# Patient Record
Sex: Female | Born: 1955 | Race: White | Hispanic: No | Marital: Married
Health system: Southern US, Community
[De-identification: ages and names within clinical notes are randomized; demographics above are authoritative.]

---

## 2005-10-03 ENCOUNTER — Emergency Department: Payer: Self-pay | Admitting: Internal Medicine

## 2006-11-03 ENCOUNTER — Emergency Department: Payer: Self-pay | Admitting: Emergency Medicine

## 2007-08-10 ENCOUNTER — Ambulatory Visit: Payer: Self-pay

## 2008-10-25 ENCOUNTER — Ambulatory Visit: Payer: Self-pay

## 2009-10-24 ENCOUNTER — Ambulatory Visit: Payer: Self-pay

## 2010-11-13 ENCOUNTER — Ambulatory Visit: Payer: Self-pay

## 2012-02-10 ENCOUNTER — Ambulatory Visit: Payer: Self-pay

## 2012-03-15 IMAGING — MG MM DIGITAL SCREENING BILAT W/ CAD
1 series · 4 of 4 positions shown · non-contrast
Comparison: none

REASON FOR EXAM: screening
COMMENTS:

[Series 2789: R CC · right · 4 of 4 slices shown]
[im 1/4]
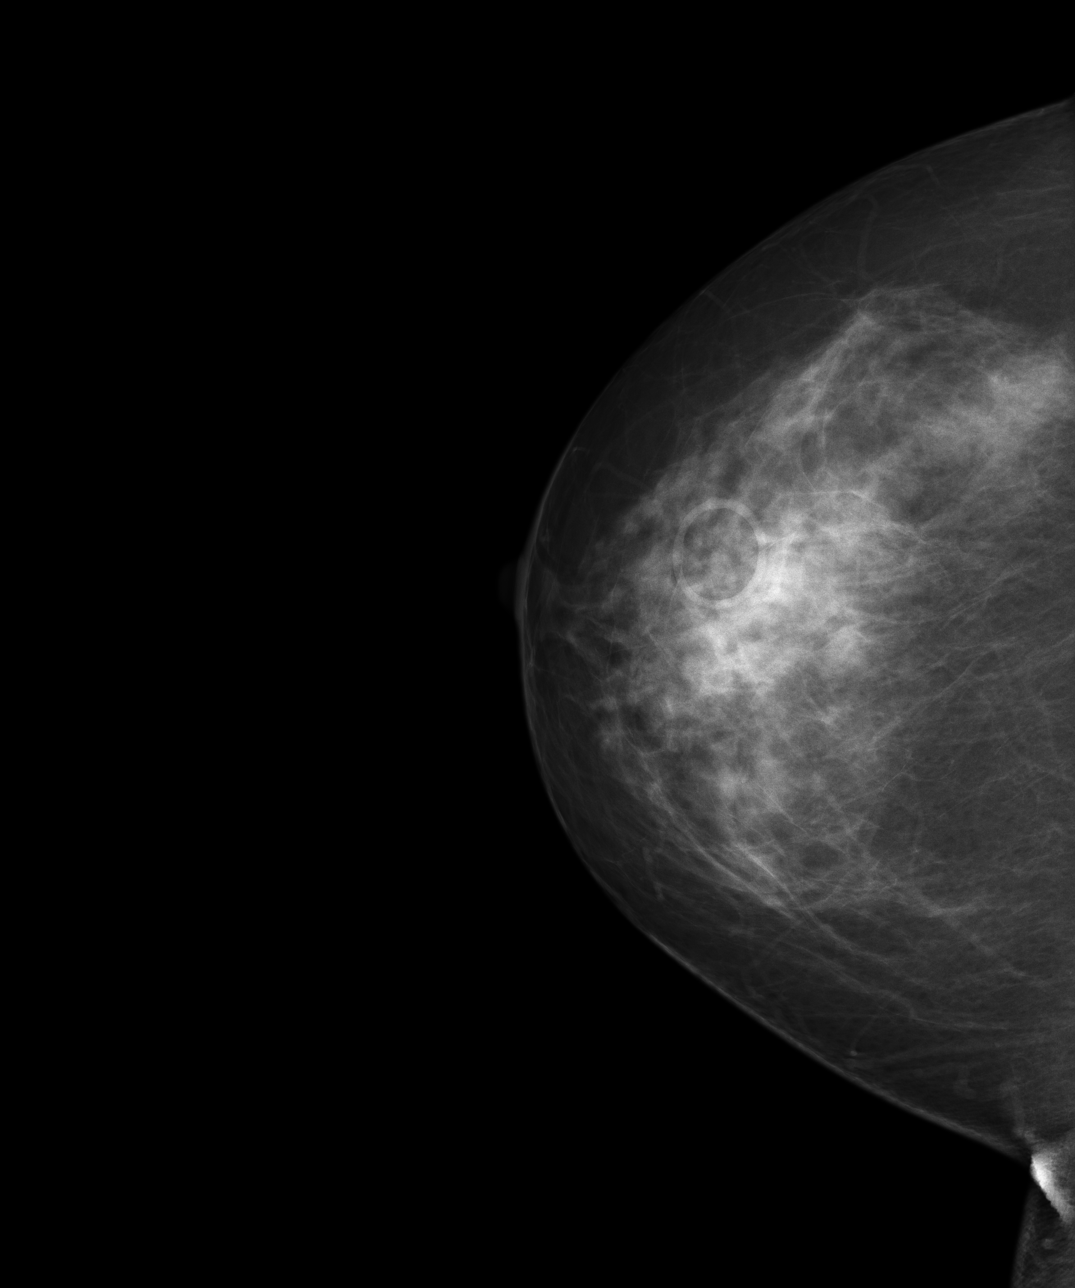
[im 2/4]
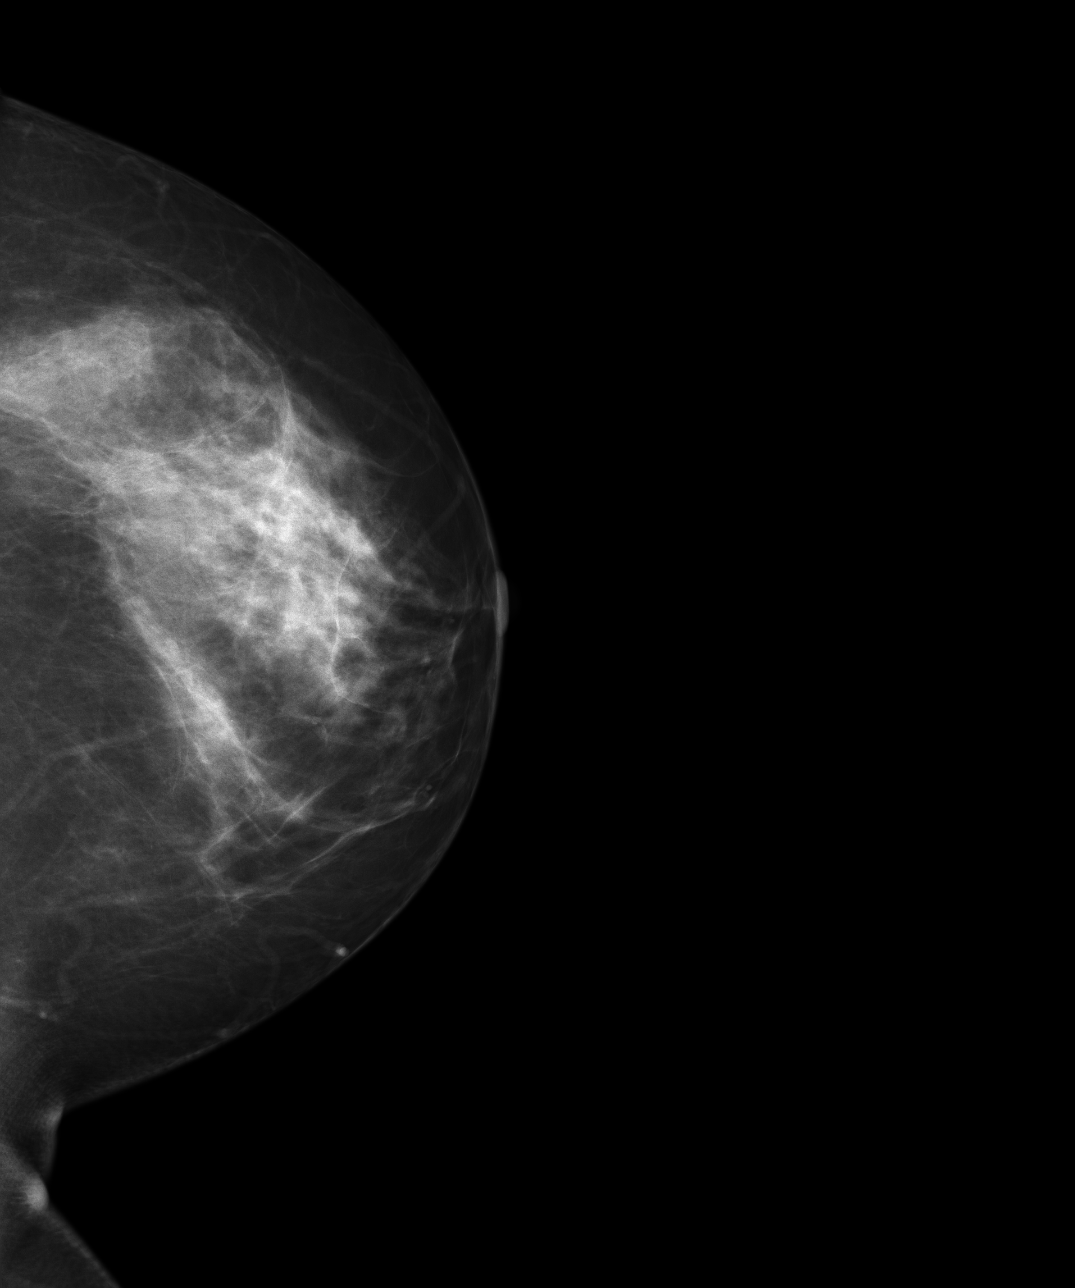
[im 3/4]
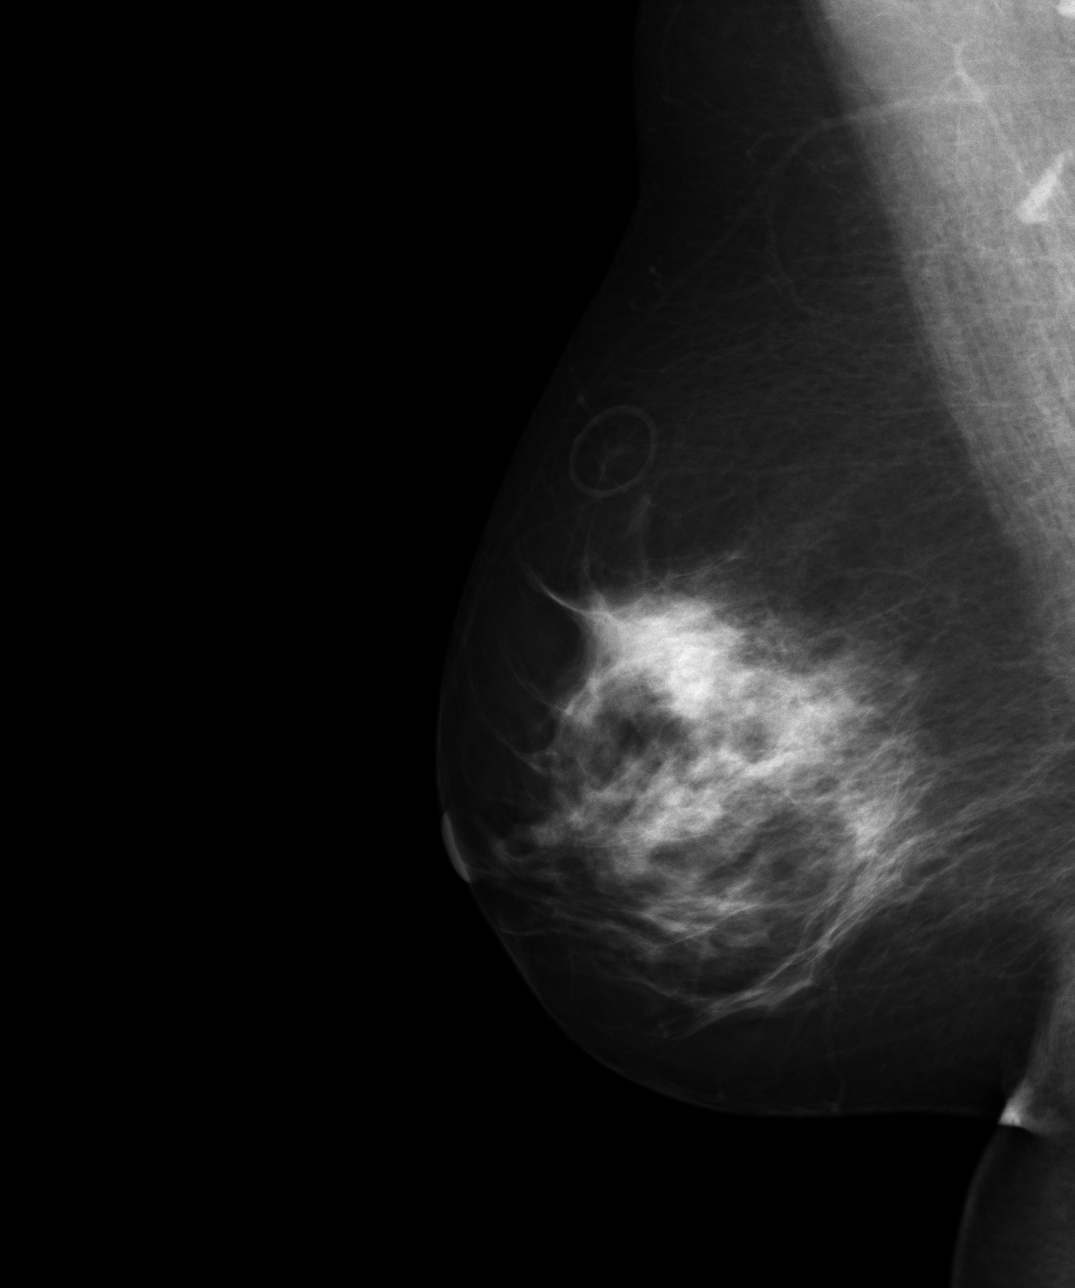
[im 4/4]
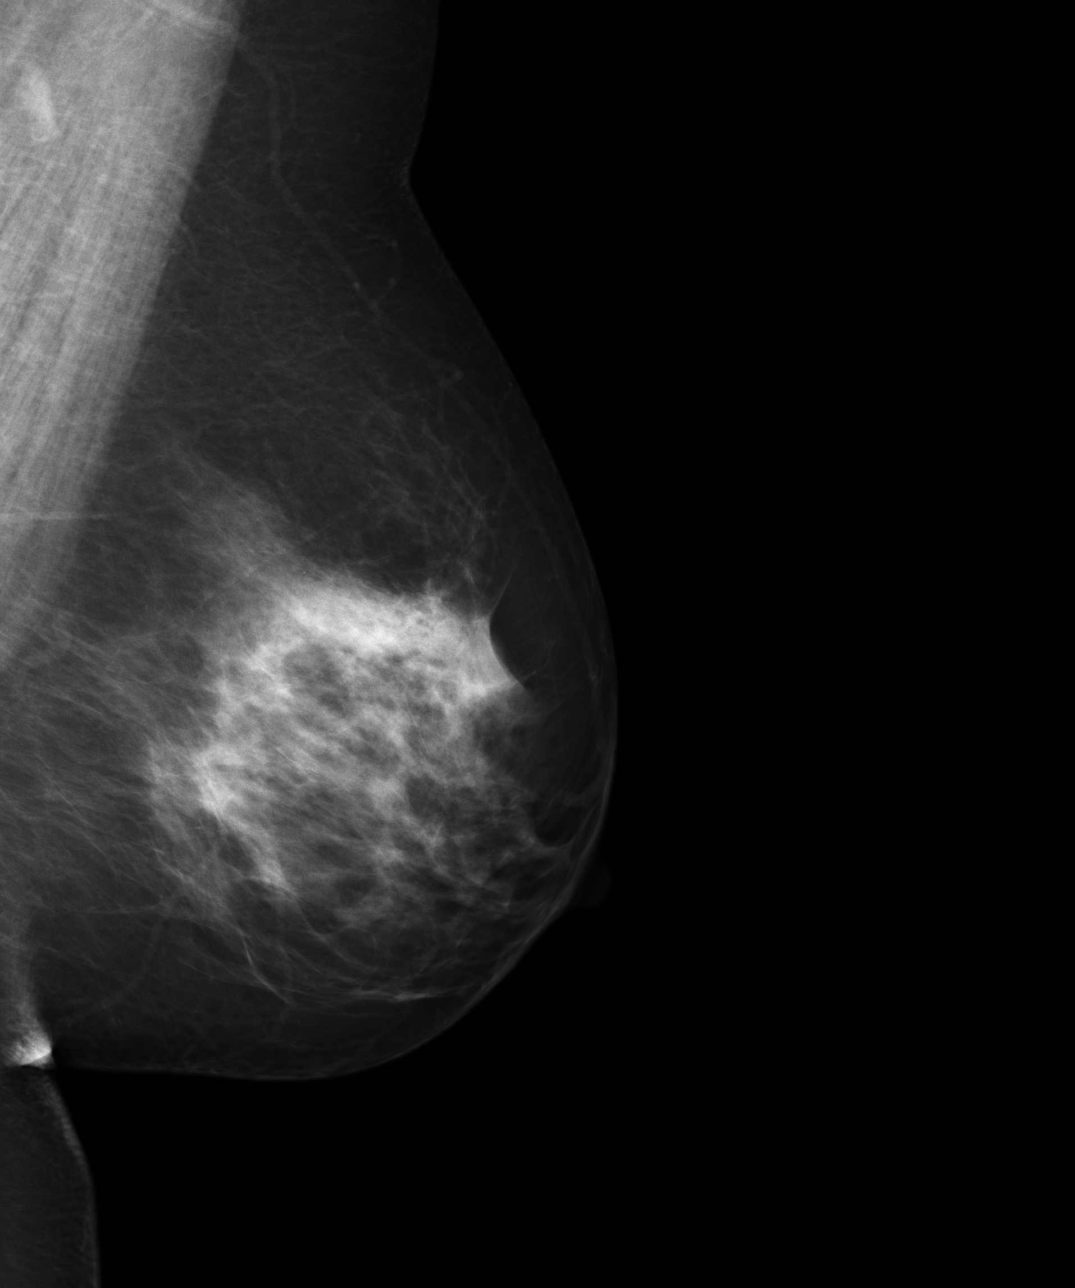

[4 of 4 positions shown; findings below may reference images not displayed]

PROCEDURE:     MAM - MAM [REDACTED] DIG SCREEN MAM W/CAD  - November 13, 2010  [DATE]

RESULT:     Comparison is made to previous analog images dated 10-28-05 from
[facility]HOSP[facility] [H[facility]] in [place], [place] [HOS[facility]] and to
previous digital images from [HOSPITAL] dated 10-24-09, as well as
10-25-08 and 08-10-07.  The breasts exhibit a moderate to dense parenchymal
pattern. A skin lesion is marked in the junction of the mid and anterior
thirds in the upper central right breast. No developing parenchymal density
or dominant mass is seen. There is no architectural distortion. The
appearance is essentially stable.
IMPRESSION: 1.Stable, benign appearing bilateral mammogram.

BI-RADS: Category 2 - Benign Findings

RECOMMENDATIONS:

1.     Please continue to encourage yearly mammographic follow-up.

A NEGATIVE MAMMOGRAM REPORT DOES NOT PRECLUDE BIOPSY OR OTHER EVALUATION OF
A CLINICALLY PALPABLE OR OTHERWISE SUSPICIOUS MASS OR LESION. BREAST CANCER
MAY NOT BE DETECTED BY MAMMOGRAPHY IN UP TO 10% OF CASES.

## 2013-02-21 ENCOUNTER — Ambulatory Visit: Payer: Self-pay

## 2013-04-15 ENCOUNTER — Emergency Department: Payer: Self-pay | Admitting: Emergency Medicine

## 2013-06-12 IMAGING — MG MM DIGITAL SCREENING BILAT W/ CAD
1 series · 4 of 4 positions shown · non-contrast
Comparison: none

REASON FOR EXAM: annual
COMMENTS:

PROCEDURE:     MAM - MAM [REDACTED] DIG SCREEN MAM W/CAD  - February 10, 2012  [DATE]
RESULT:     COMPARISON:  11/25/1910, 10/24/2009, 10/25/2008, 08/10/2007
TECHNIQUE: Digital screening mammograms were obtained. FDA approved
computer-aided detection (CAD) for mammography was utilized for this study.

[R CC · right · 4 of 4 slices shown]
[im 1/4]
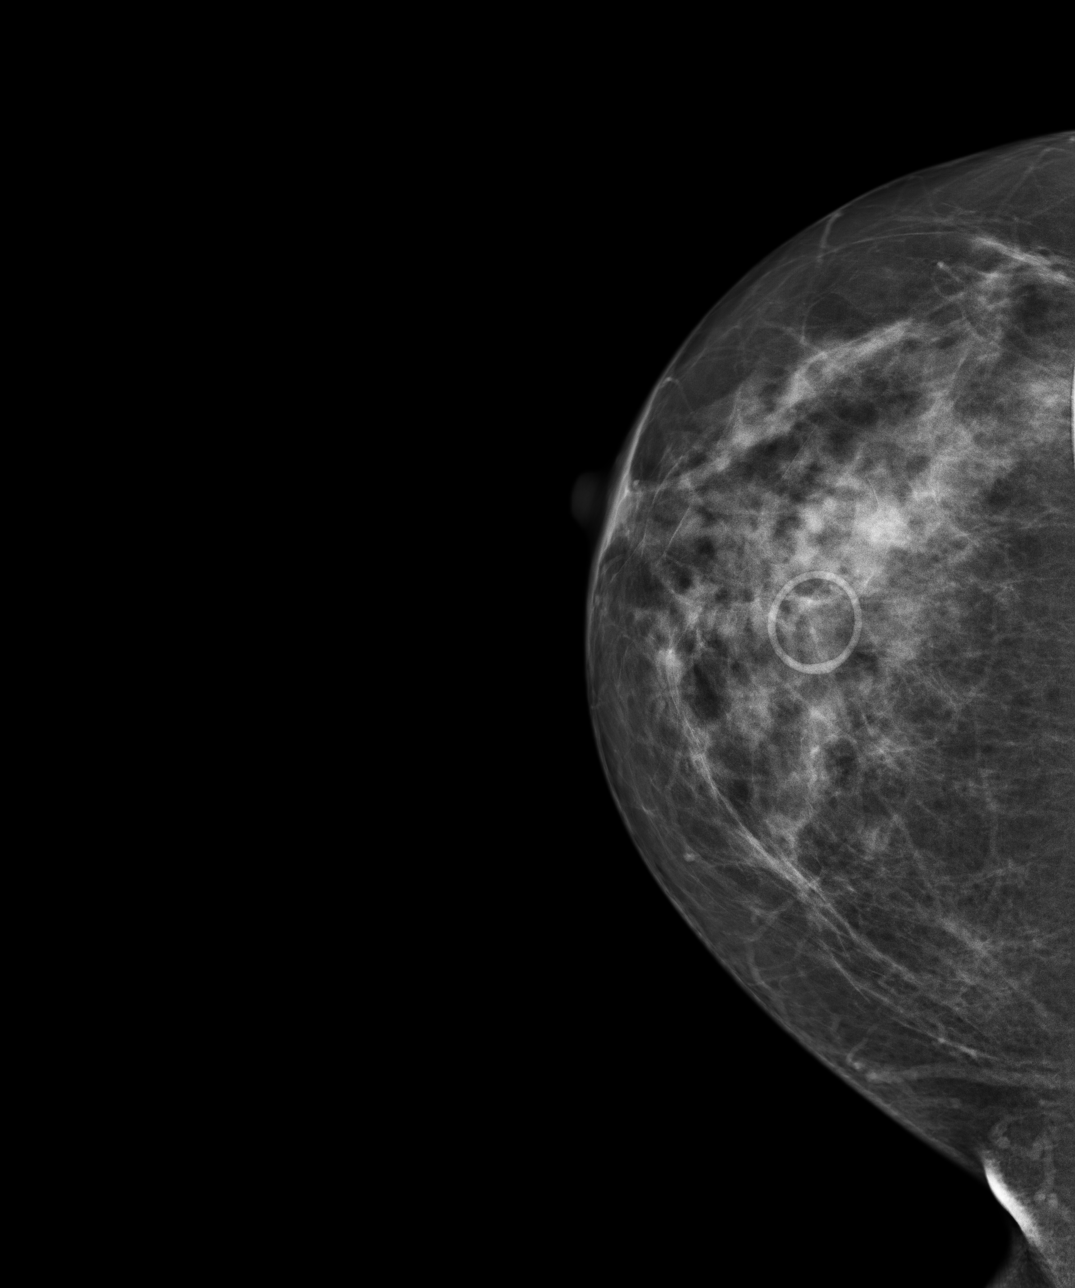
[im 2/4]
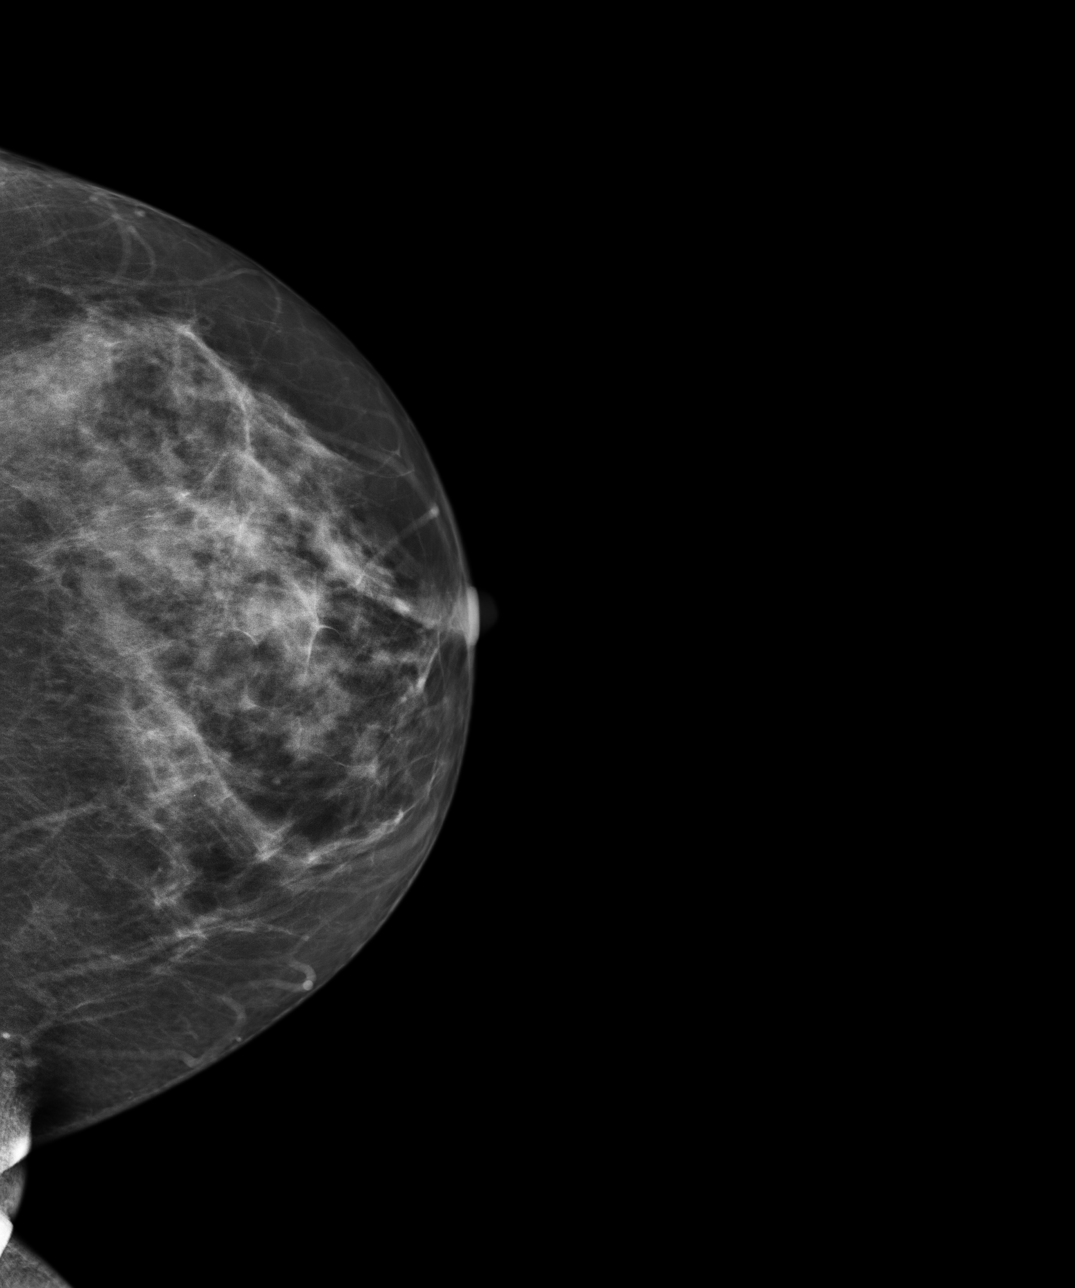
[im 3/4]
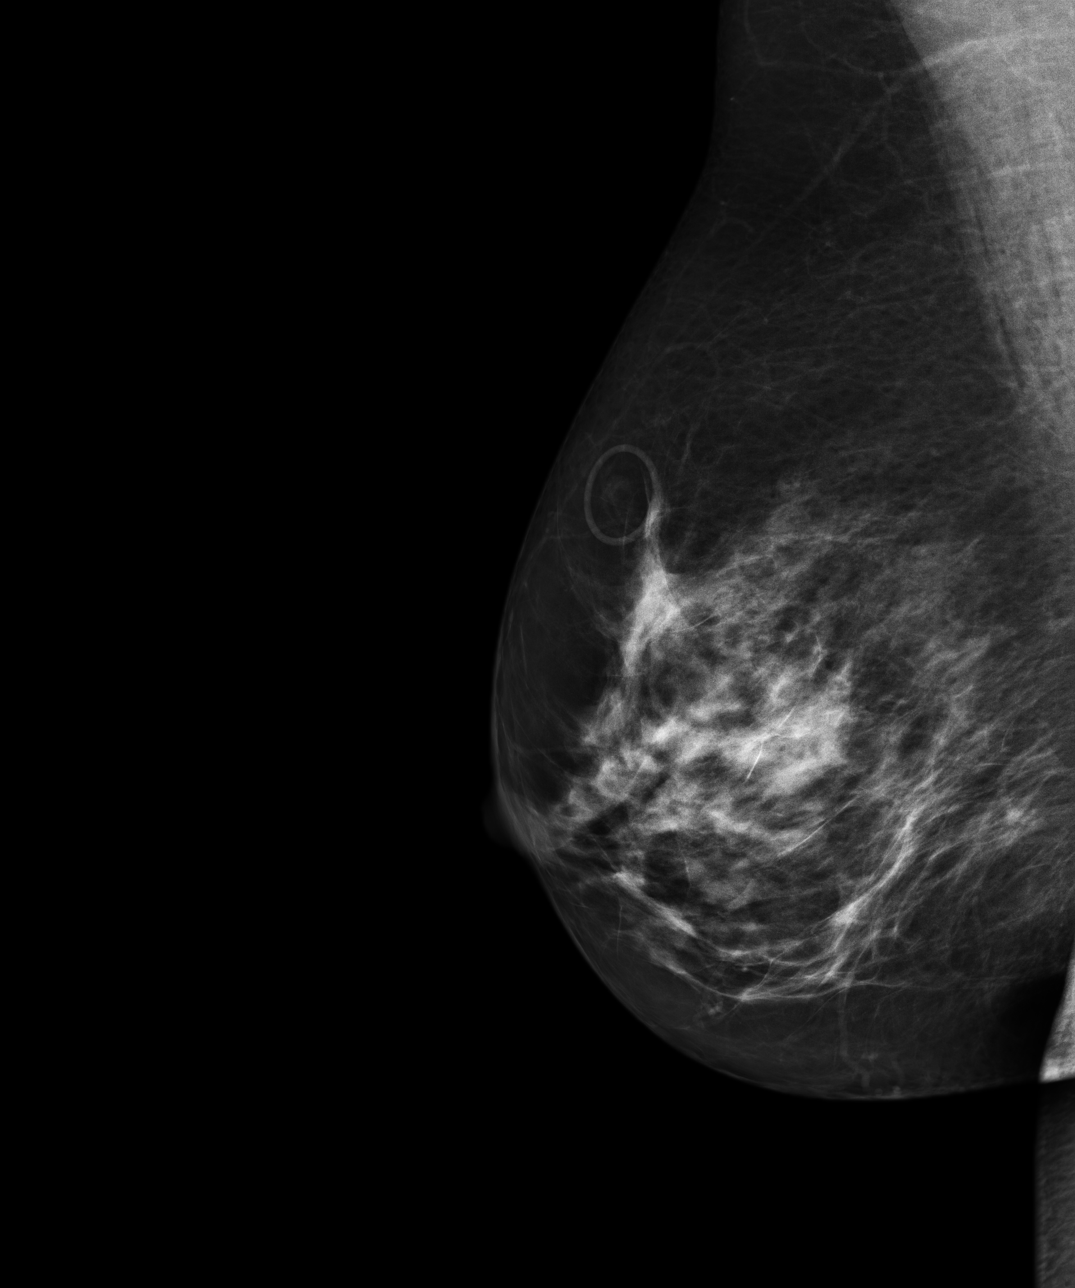
[im 4/4]
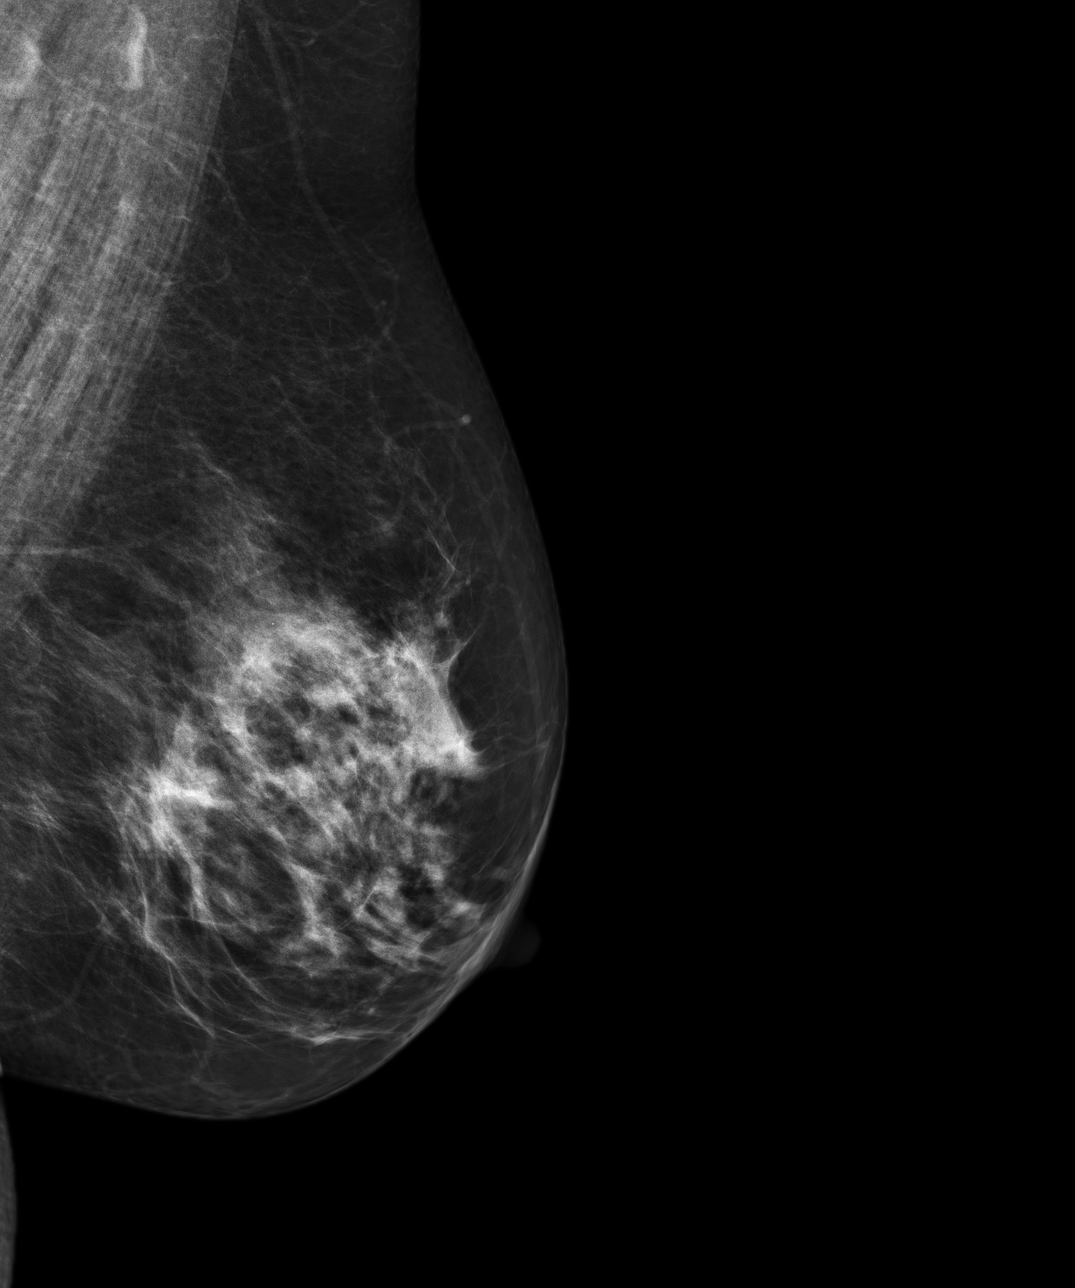

[4 of 4 positions shown; findings below may reference images not displayed]

FINDING: Bilateral breasts are heterogeneously dense which may lower the sensitivity
of mammography.  There is no dominant mass, architectural distortion or
clusters of suspicious microcalcifications.
IMPRESSION: 1.     Stable bilateral mammogram.
2.     Annual mammographic follow up recommended.
3.     BI-RADS:  Category 2- Benign.

A negative mammogram report does not preclude biopsy or other evaluation of
a clinically palpable or otherwise suspicious mass or lesion. Breast cancer
may not be detected by mammography in up to 10% of cases.

[REDACTED]

## 2013-11-26 ENCOUNTER — Emergency Department: Payer: Self-pay | Admitting: Emergency Medicine

## 2014-04-12 ENCOUNTER — Ambulatory Visit: Payer: Self-pay

## 2014-08-16 IMAGING — CR DG FOOT COMPLETE 3+V*L*
1 series · 3 of 3 positions shown · non-contrast
Comparison: None

CLINICAL DATA: Left foot pain for 2 weeks, no known injury

EXAM:
LEFT FOOT - COMPLETE 3+ VIEW

[Series 1: ap · 0.17mm/px · 3 of 3 slices shown]
[im 1/3]
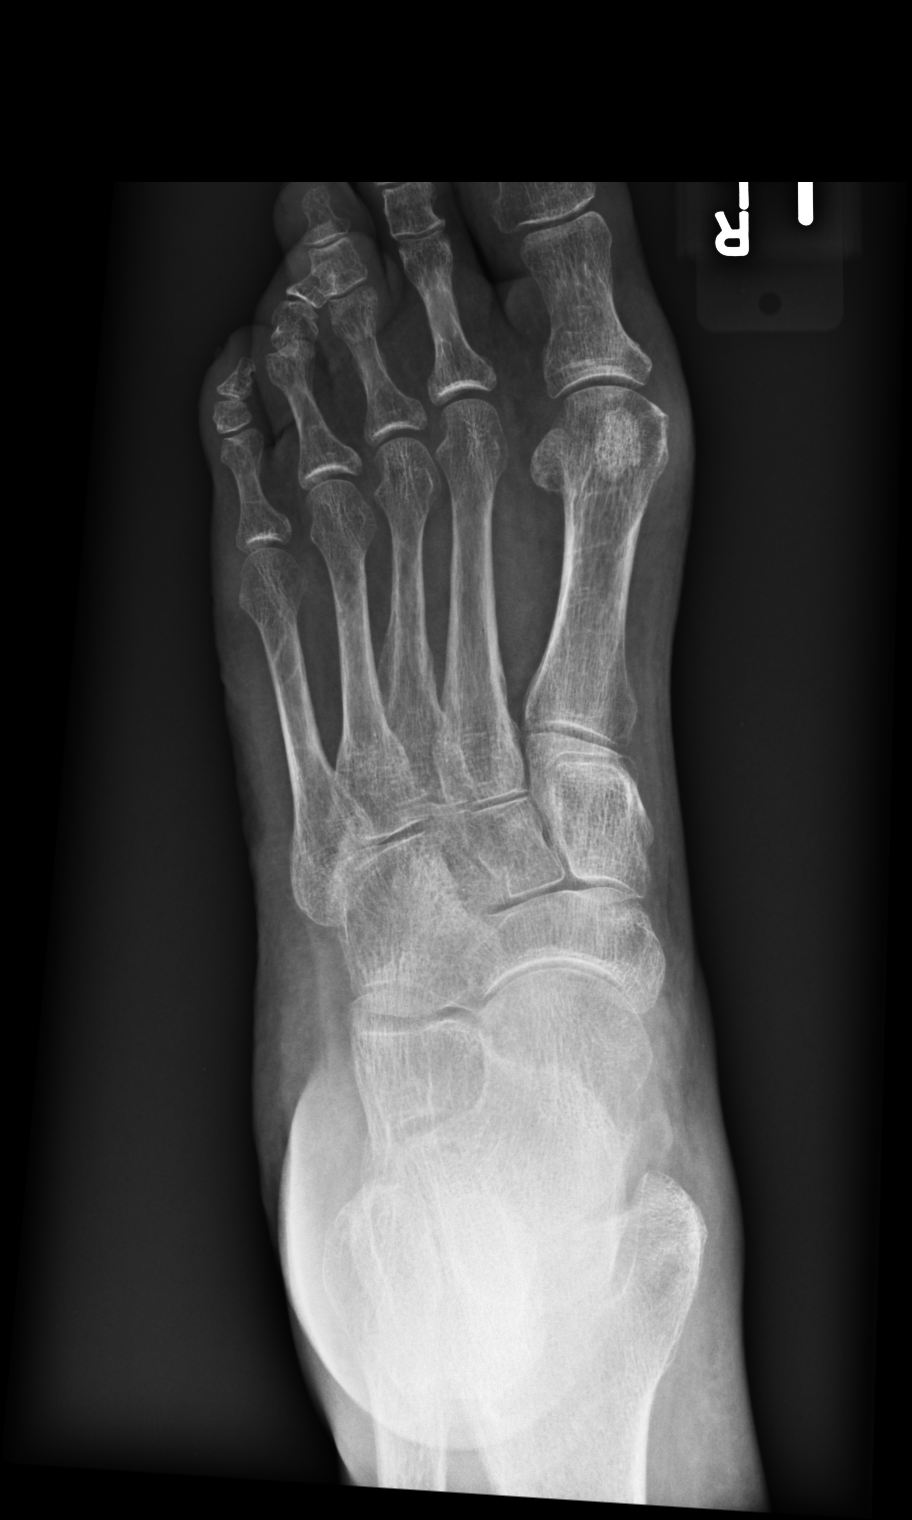
[im 2/3]
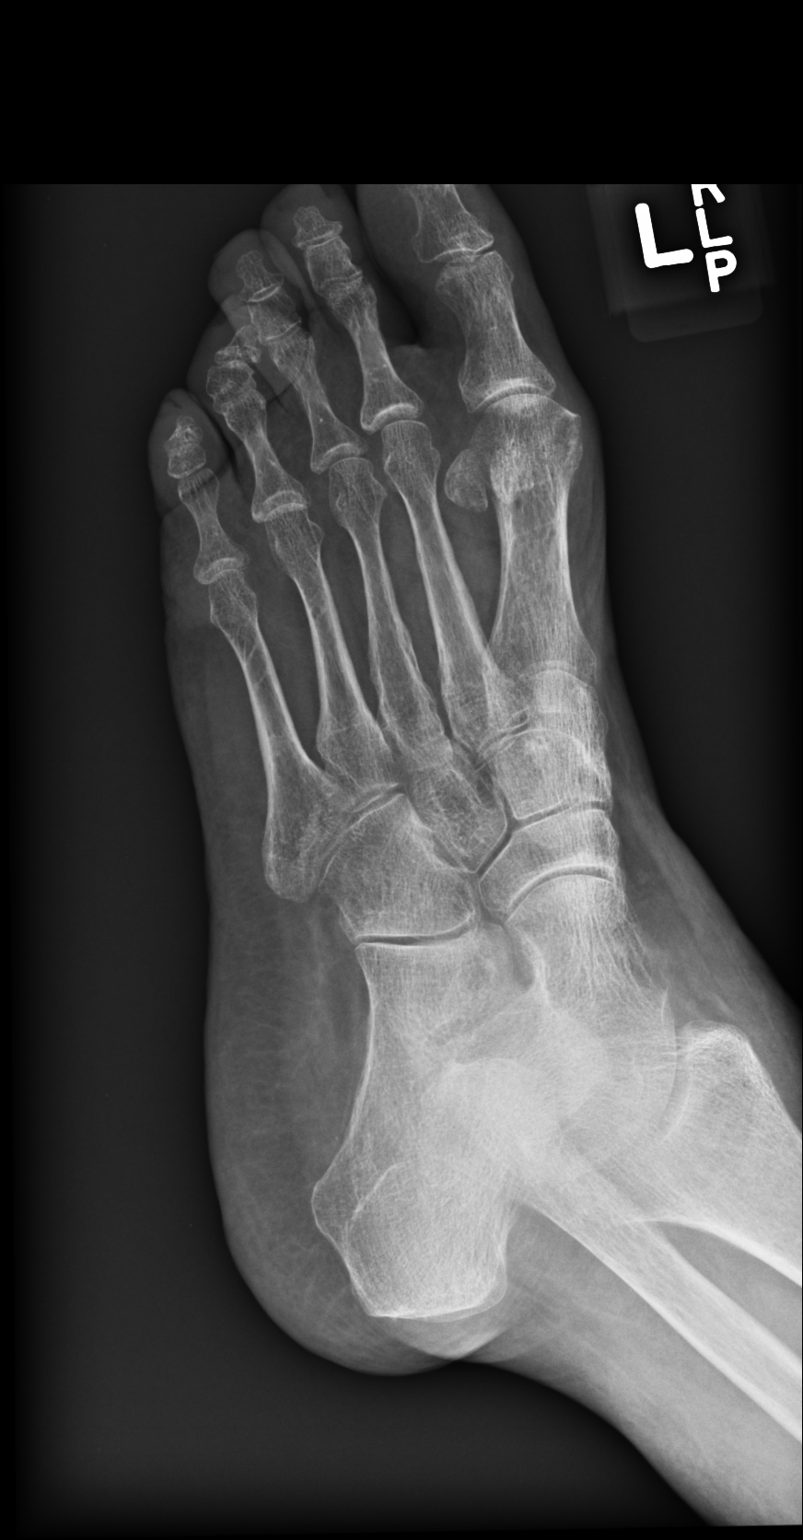
[im 3/3]
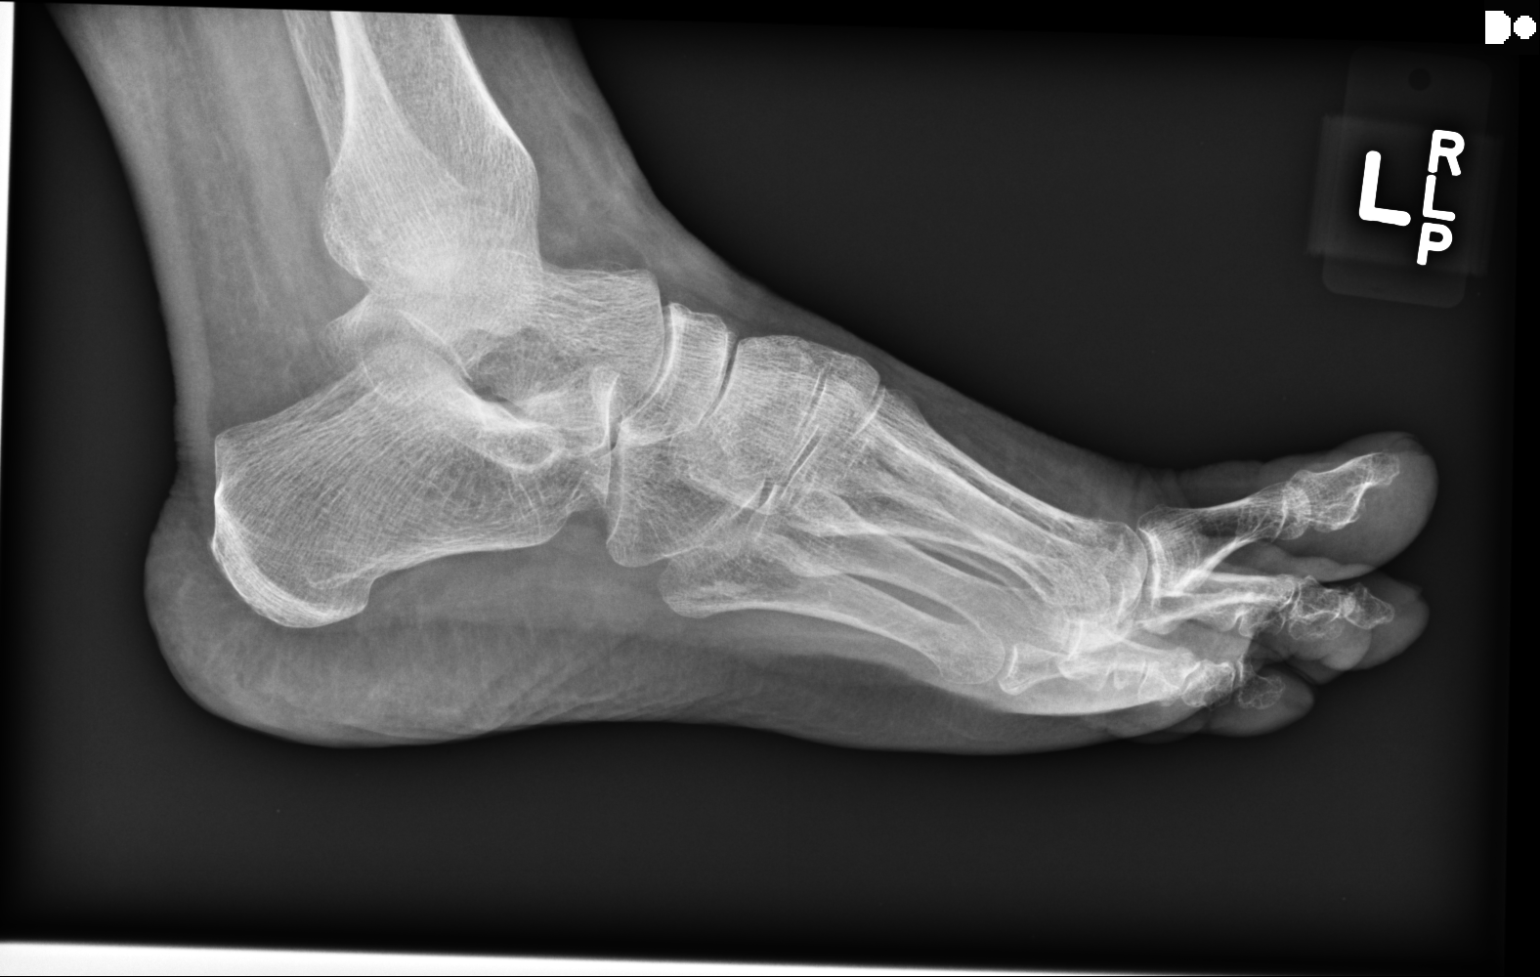

[3 of 3 positions shown; findings below may reference images not displayed]

FINDINGS: Diffuse osseous demineralization.

Mild flexion deformities of the 4th and 5th toes.

Joint spaces preserved.

No acute fracture, dislocation or bone destruction.

Anterior soft tissue swelling at ankle.
IMPRESSION: No acute osseous abnormalities.

Anterior soft tissue swelling at ankle.

## 2015-02-12 ENCOUNTER — Telehealth: Payer: Self-pay | Admitting: Family Medicine

## 2015-02-12 NOTE — Telephone Encounter (Signed)
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 She was seen for SOB and a CXR was done. [facility] to oncology to be seen in the next few days. They will let us know if they need a referral from the PCP.

## 2015-07-07 DEATH — deceased
